# Patient Record
Sex: Male | Born: 1965 | Race: White | Hispanic: No | Marital: Single | Smoking: Current every day smoker
Health system: Southern US, Community
[De-identification: ages and names within clinical notes are randomized; demographics above are authoritative.]

## PROBLEM LIST (undated history)

## (undated) HISTORY — PX: BACK SURGERY: SHX140

---

## 2005-03-18 ENCOUNTER — Inpatient Hospital Stay (HOSPITAL_COMMUNITY): Admission: RE | Admit: 2005-03-18 | Discharge: 2005-03-20 | Payer: Self-pay | Admitting: Neurosurgery

## 2005-04-25 ENCOUNTER — Encounter: Admission: RE | Admit: 2005-04-25 | Discharge: 2005-04-25 | Payer: Self-pay | Admitting: Neurosurgery

## 2005-05-30 ENCOUNTER — Encounter: Admission: RE | Admit: 2005-05-30 | Discharge: 2005-05-30 | Payer: Self-pay | Admitting: Neurosurgery

## 2005-09-17 ENCOUNTER — Encounter: Admission: RE | Admit: 2005-09-17 | Discharge: 2005-09-17 | Payer: Self-pay | Admitting: Neurosurgery

## 2006-11-22 IMAGING — CR DG LUMBAR SPINE 2-3V
3 series · 3 of 3 positions shown · non-contrast
Comparison: 04/25/2005.

<!--  IDXRADR:ADDEND:BEGIN -->Addendum Begins
<!--  IDXRADR:ADDEND:INNER_BEGIN -->Addendum: The beginning of the report should read " Five nonrib-bearing lumbar
vertebrae."
<!--  IDXRADR:ADDEND:INNER_END -->Addendum Ends
<!--  IDXRADR:ADDEND:END -->Clinical Data:  Low back and right leg pain. Lumbar fusion on 03/18/2005.

LUMBAR SPINE - 3 VIEW

[view not recorded (1 of 3)]
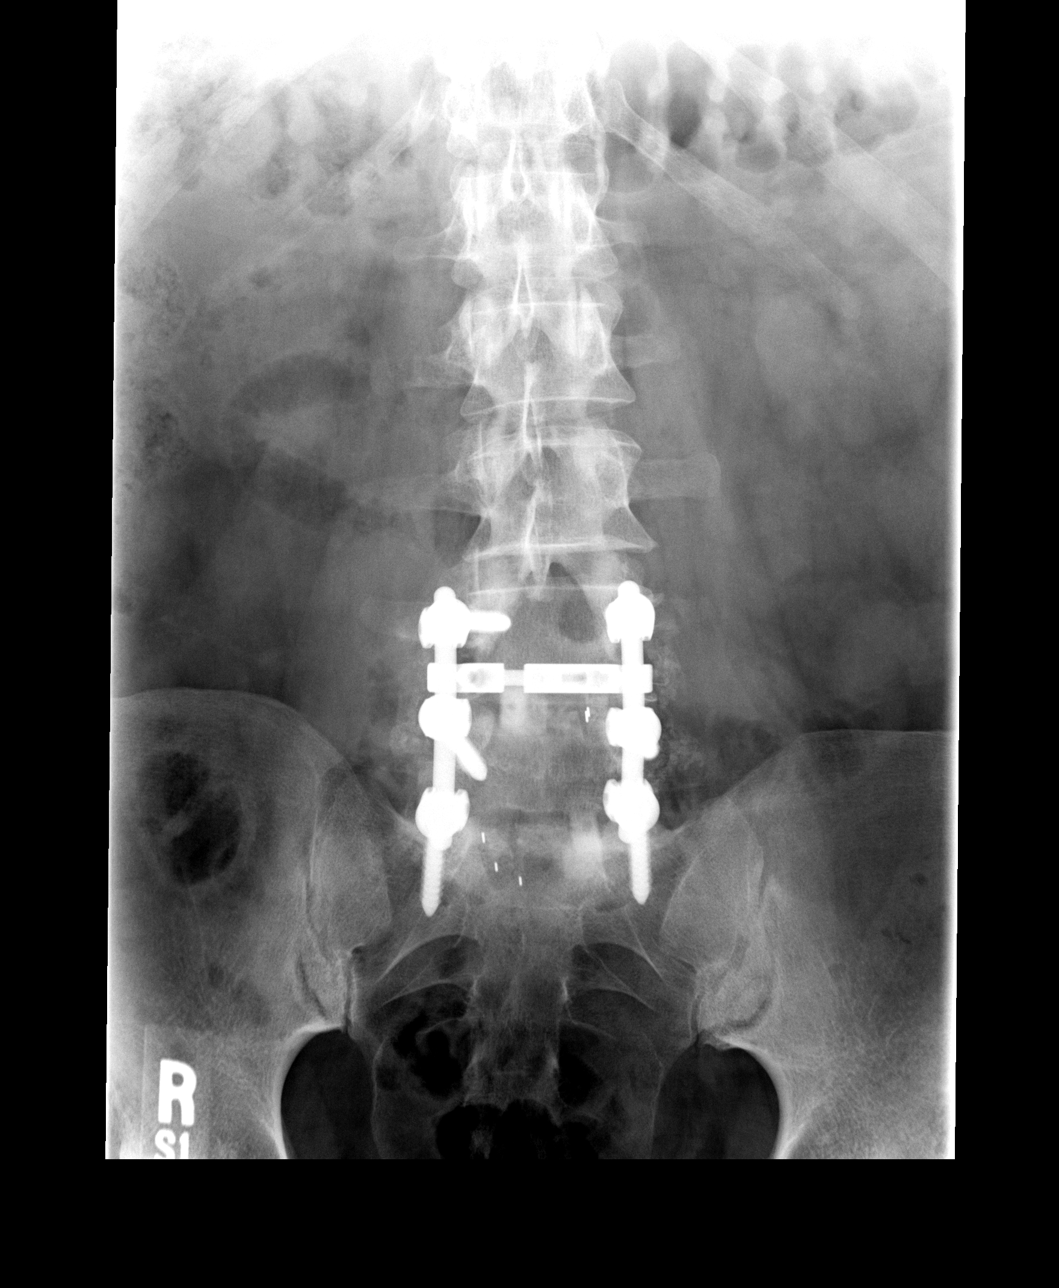

[view not recorded (2 of 3)]
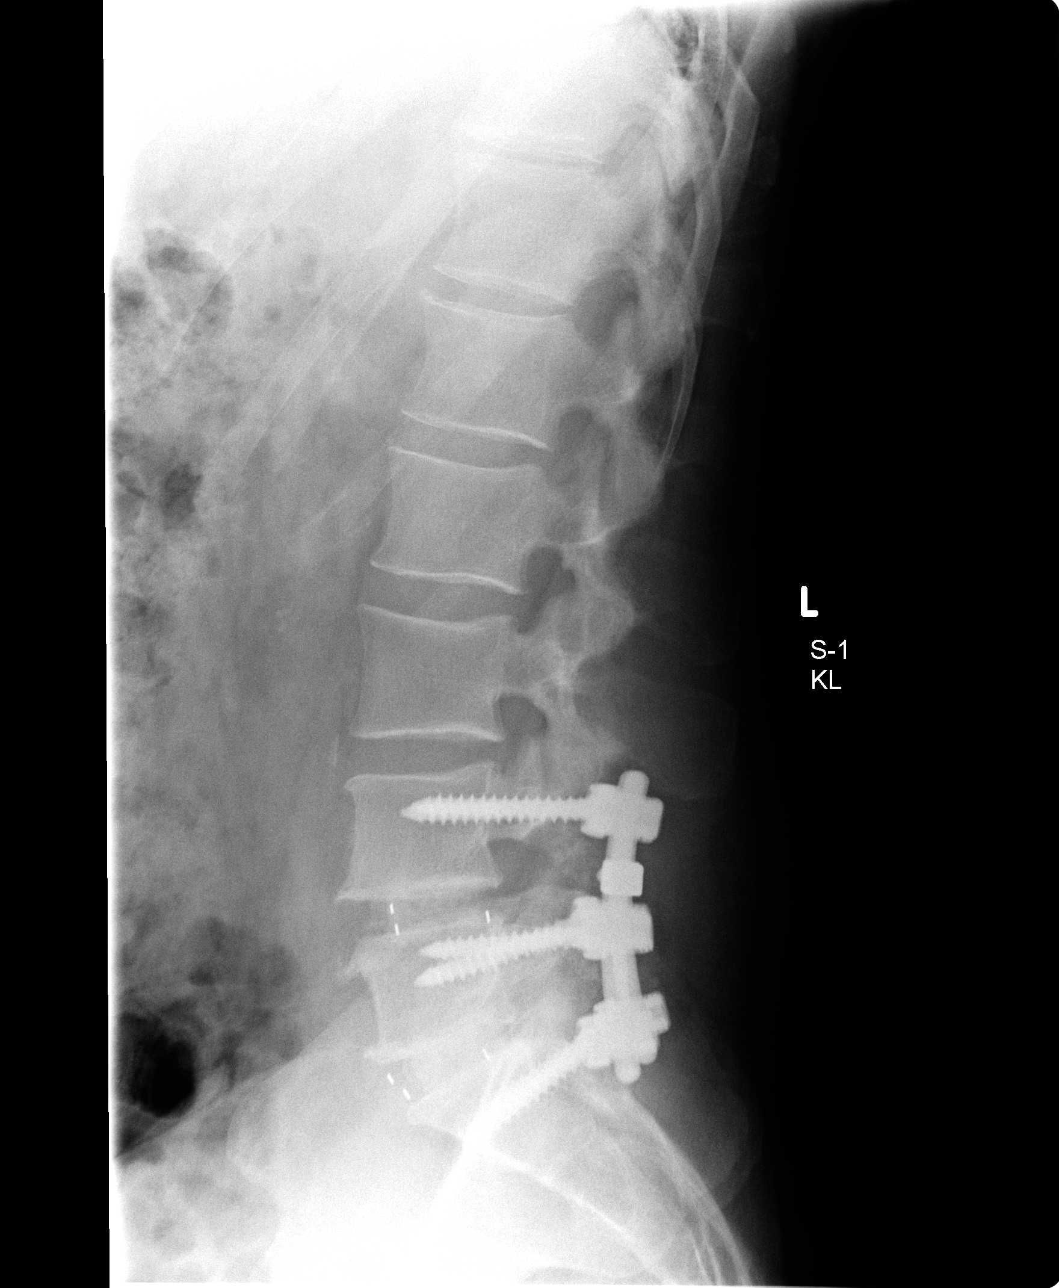

[view not recorded (3 of 3)]
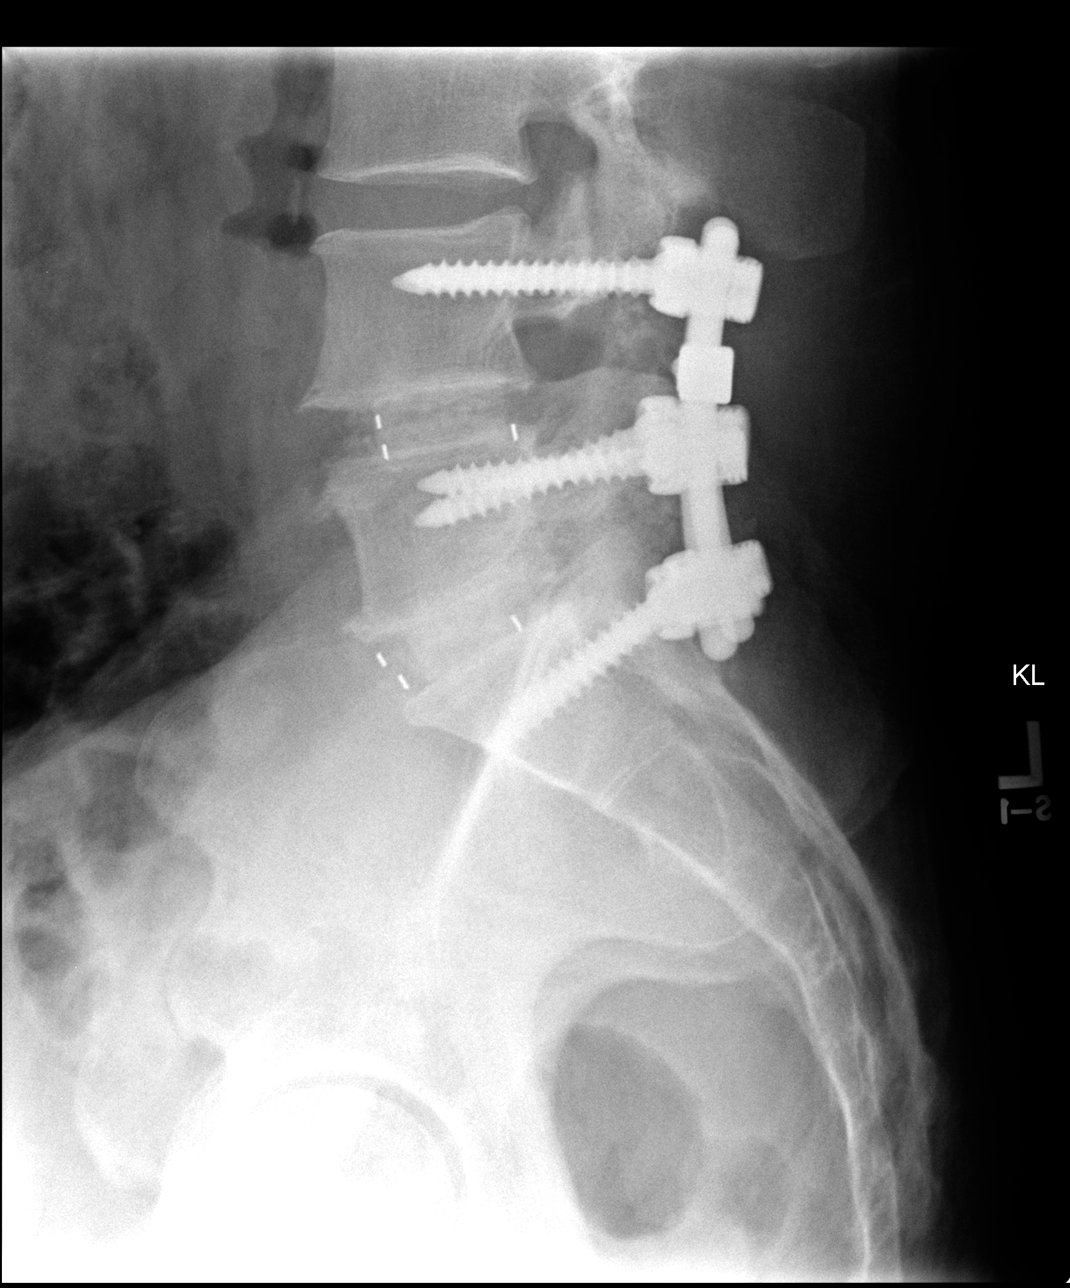

[3 of 3 positions shown; findings below may reference images not displayed]

FINDINGS: Five  bearing lumbar vertebrae with stable minimal levoconvex lumbar
rotary scoliosis. Stable laminectomy defects, interbody bone plugs and pedicle
screw and rod fixation at the L4 through S1 levels with normal alignment. Stable
mild to moderate anterior spur formation at those levels and mild anterior spur
formation at the L3-L4 level.

IMPRESSION

Stable postoperative changes and minimal scoliosis, as described above.

## 2014-12-03 ENCOUNTER — Encounter (HOSPITAL_BASED_OUTPATIENT_CLINIC_OR_DEPARTMENT_OTHER): Payer: Self-pay | Admitting: *Deleted

## 2014-12-03 ENCOUNTER — Emergency Department (HOSPITAL_BASED_OUTPATIENT_CLINIC_OR_DEPARTMENT_OTHER)
Admission: EM | Admit: 2014-12-03 | Discharge: 2014-12-03 | Disposition: A | Discharge status: Home / Self Care | Payer: Self-pay | Attending: Emergency Medicine | Admitting: Emergency Medicine

## 2014-12-03 DIAGNOSIS — L0291 Cutaneous abscess, unspecified: Secondary | ICD-10-CM

## 2014-12-03 DIAGNOSIS — Z720 Tobacco use: Secondary | ICD-10-CM | POA: Insufficient documentation

## 2014-12-03 DIAGNOSIS — L02415 Cutaneous abscess of right lower limb: Secondary | ICD-10-CM | POA: Insufficient documentation

## 2014-12-03 DIAGNOSIS — L02414 Cutaneous abscess of left upper limb: Secondary | ICD-10-CM | POA: Insufficient documentation

## 2014-12-03 MED ORDER — LIDOCAINE-EPINEPHRINE (PF) 2 %-1:200000 IJ SOLN
10.0000 mL | Freq: Once | INTRAMUSCULAR | Status: AC
  Administered 2014-12-03: 10 mL via INTRADERMAL
  Filled 2014-12-03: qty 10

## 2014-12-03 MED ORDER — SULFAMETHOXAZOLE-TRIMETHOPRIM 800-160 MG PO TABS: 1.0000 | ORAL_TABLET | Freq: Two times a day (BID) | ORAL | Status: AC

## 2014-12-03 NOTE — ED Provider Notes (Signed)
CSN: [id number]     Arrival date & time 12/03/14  1751 History   First MD Initiated Contact with Patient 12/03/14 1813     Chief Complaint  Patient presents with  . Wound Check     (Consider location/radiation/quality/duration/timing/severity/associated sxs/prior Treatment) HPI Comments: 49 year old male presenting with concerns of possible spider bites to his left hand (8-9 days ago), left forearm and right knee(over the past few days). States he has been cleaning out his shed where there are many spiders which he is not sure if they're brown recluse or black widow and believes he was bit. States the area on his left hand has gotten very swollen, he sterilized razor and was able to express purulent drainage with great relief. Denies any pain to his hand. He was also able to drain the area on his right knee which decreased the redness around there. No fevers.  Patient is a 49 y.o. male presenting with wound check. The history is provided by the patient.  Wound Check    History reviewed. No pertinent past medical history. Past Surgical History  Procedure Laterality Date  . Back surgery     No family history on file. Social History  Substance Use Topics  . Smoking status: Current Every Day Smoker    Types: Cigarettes  . Smokeless tobacco: Current User    Types: Snuff  . Alcohol Use: Yes     Comment: occasional    Review of Systems  Skin: Positive for wound.  All other systems reviewed and are negative.     Allergies  Review of patient's allergies indicates no known allergies.  Home Medications   Prior to Admission medications   Medication Sig Start Date End Date Taking? Authorizing Provider  sulfamethoxazole-trimethoprim (BACTRIM DS,SEPTRA DS) 800-160 MG per tablet Take 1 tablet by mouth 2 (two) times daily. 12/03/14 12/10/14  Camaron Cammack M Kelleen Stolze, PA-C   BP 131/91 mmHg  Pulse 100  Temp(Src) 98.3 F (36.8 C) (Oral)  Resp 18  Ht  (1.88 m)  Wt 190 lb (86.183 kg)  BMI  24.38 kg/m2  SpO2 97% Physical Exam  Constitutional: He is oriented to person, place, and time. He appears well-developed and well-nourished. No distress.  HENT:  Head: Normocephalic and atraumatic.  Eyes: Conjunctivae and EOM are normal.  Neck: Normal range of motion. Neck supple.  Cardiovascular: Normal rate, regular rhythm and normal heart sounds.   Pulmonary/Chest: Effort normal and breath sounds normal.  Musculoskeletal: Normal range of motion. He exhibits no edema.  1 cm red area on lateral aspect of base of the left MCP with central eschar. No fluctuance or induration. No tenderness. Full flexion and extension of the fifth digit without pain. 1 cm area of redness below right knee with central eschar. No surrounding cellulitis. Mild tenderness. No fluctuance or induration. 3 cm diameter area of redness and warmth to left forearm with 1 cm area of induration and central pustule. Tender.  Neurological: He is alert and oriented to person, place, and time.  Skin: Skin is warm and dry.  Psychiatric: He has a normal mood and affect. His behavior is normal.  Nursing note and vitals reviewed.   ED Course  Procedures (including critical care time) INCISION AND DRAINAGE Performed by: Celene Skeen. Reatha Harps, PA-S Consent: Verbal consent obtained. Risks and benefits: risks, benefits and alternatives were discussed Type: abscess  Body area: left forearm  Anesthesia: local infiltration  Incision was made with a scalpel.  Local anesthetic: lidocaine 2% with  epinephrine  Anesthetic total: 2 ml  Complexity: complex Blunt dissection to break up loculations  Drainage: purulent  Drainage amount: small  Packing material: none  Patient tolerance: Patient tolerated the procedure well with no immediate complications.   Labs Review Labs Reviewed - No data to display  Imaging Review No results found. I, Celene Skeen, personally reviewed and evaluated these images and lab  results as part of my medical decision-making.   EKG Interpretation None      MDM   Final diagnoses:  Abscess of left arm  Abscess of multiple sites   Nontoxic appearing, NAD. AF VSS. Has multiple abscesses that he did drain with relief. Only one able to be drained here today with a small area of surrounding cellulitis. Will treat with Bactrim for possible MRSA coverage. Follow-up with PCP in 2-3 days for recheck. Stable for discharge. Return precautions given. Patient states understanding of treatment care plan and is agreeable.  Kathrynn Speed, PA-C 12/03/14 1918  Blake Divine, MD 12/03/14 [phone]

## 2014-12-03 NOTE — Discharge Instructions (Signed)
Take Bactrim twice daily. Follow-up with your primary care physician in 2-3 days for recheck.  Abscess Care After An abscess (also called a boil or furuncle) is an infected area that contains a collection of pus. Signs and symptoms of an abscess include pain, tenderness, redness, or hardness, or you may feel a moveable soft area under your skin. An abscess can occur anywhere in the body. The infection may spread to surrounding tissues causing cellulitis. A cut (incision) by the surgeon was made over your abscess and the pus was drained out. Gauze may have been packed into the space to provide a drain that will allow the cavity to heal from the inside outwards. The boil may be painful for 5 to 7 days. Most people with a boil do not have high fevers. Your abscess, if seen early, may not have localized, and may not have been lanced. If not, another appointment may be required for this if it does not get better on its own or with medications. HOME CARE INSTRUCTIONS   Only take over-the-counter or prescription medicines for pain, discomfort, or fever as directed by your caregiver.  When you bathe, soak and then remove gauze or iodoform packs at least daily or as directed by your caregiver. You may then wash the wound gently with mild soapy water. Repack with gauze or do as your caregiver directs. SEEK IMMEDIATE MEDICAL CARE IF:   You develop increased pain, swelling, redness, drainage, or bleeding in the wound site.  You develop signs of generalized infection including muscle aches, chills, fever, or a general ill feeling.  An oral temperature above 102 F (38.9 C) develops, not controlled by medication. See your caregiver for a recheck if you develop any of the symptoms described above. If medications (antibiotics) were prescribed, take them as directed. Document Released: 10/25/2004 Document Revised: 07/01/2011 Document Reviewed: 06/22/2007 [facility] Patient Information 2015 [place], [place]. This  information is not intended to replace advice given to you by your health care provider. Make sure you discuss any questions you have with your health care provider.

## 2014-12-03 NOTE — ED Notes (Addendum)
Pt reports bitten by ?spider 8-9 days ago on left hand- circular wound with brown center noted to base of 5th finger- pt has 2 new reddened areas ?bites- one on left forearm and one on right leg- pt reports he has been cleaning out his shed due to black widows
